# Patient Record
Sex: Male | Born: 1990 | Race: Black or African American | Hispanic: No | Marital: Single | Smoking: Never smoker
Health system: Southern US, Community
[De-identification: ages and names within clinical notes are randomized; demographics above are authoritative.]

---
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
## 2016-08-27 ENCOUNTER — Emergency Department (HOSPITAL_COMMUNITY)
Admission: EM | Admit: 2016-08-27 | Discharge: 2016-08-27 | Disposition: A | Discharge status: Home / Self Care | Payer: Self-pay | Attending: Emergency Medicine | Admitting: Emergency Medicine

## 2016-08-27 ENCOUNTER — Encounter (HOSPITAL_COMMUNITY): Payer: Self-pay | Admitting: Emergency Medicine

## 2016-08-27 ENCOUNTER — Emergency Department (HOSPITAL_COMMUNITY): Payer: Self-pay

## 2016-08-27 DIAGNOSIS — S63502A Unspecified sprain of left wrist, initial encounter: Secondary | ICD-10-CM | POA: Insufficient documentation

## 2016-08-27 DIAGNOSIS — Y929 Unspecified place or not applicable: Secondary | ICD-10-CM | POA: Insufficient documentation

## 2016-08-27 DIAGNOSIS — Y999 Unspecified external cause status: Secondary | ICD-10-CM | POA: Insufficient documentation

## 2016-08-27 DIAGNOSIS — S60812A Abrasion of left wrist, initial encounter: Secondary | ICD-10-CM | POA: Insufficient documentation

## 2016-08-27 DIAGNOSIS — Z23 Encounter for immunization: Secondary | ICD-10-CM | POA: Insufficient documentation

## 2016-08-27 DIAGNOSIS — Y939 Activity, unspecified: Secondary | ICD-10-CM | POA: Insufficient documentation

## 2016-08-27 MED ORDER — TETANUS-DIPHTH-ACELL PERTUSSIS 5-2.5-18.5 LF-MCG/0.5 IM SUSP
0.5000 mL | Freq: Once | INTRAMUSCULAR | Status: AC
  Administered 2016-08-27: 0.5 mL via INTRAMUSCULAR
  Filled 2016-08-27: qty 0.5

## 2016-08-27 NOTE — ED Notes (Signed)
Called ortho for thumb spica
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
## 2016-08-27 NOTE — ED Notes (Signed)
Ortho tech at bedside 

## 2016-08-27 NOTE — Discharge Instructions (Signed)
Wear wrist brace for at least 2 weeks for stabilization of wrist. Ice and elevate wrist throughout the day, using ice pack for no more than 20 minutes every hour.  Alternate between tylenol and motrin as needed for pain relief. Follow up with the hand specialist in 1-2 weeks for recheck of symptoms. Return to the ER for changes or worsening symptoms.
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
## 2021-01-03 ENCOUNTER — Encounter (HOSPITAL_COMMUNITY): Payer: Self-pay

## 2021-01-03 ENCOUNTER — Emergency Department (HOSPITAL_COMMUNITY): Payer: Self-pay

## 2021-01-03 ENCOUNTER — Other Ambulatory Visit: Payer: Self-pay

## 2021-01-03 ENCOUNTER — Emergency Department (HOSPITAL_COMMUNITY)
Admission: EM | Admit: 2021-01-03 | Discharge: 2021-01-03 | Disposition: A | Discharge status: Home / Self Care | Payer: Self-pay | Attending: Emergency Medicine | Admitting: Emergency Medicine

## 2021-01-03 DIAGNOSIS — S42021A Displaced fracture of shaft of right clavicle, initial encounter for closed fracture: Secondary | ICD-10-CM | POA: Insufficient documentation

## 2021-01-03 DIAGNOSIS — S60811A Abrasion of right wrist, initial encounter: Secondary | ICD-10-CM | POA: Insufficient documentation

## 2021-01-03 DIAGNOSIS — Z23 Encounter for immunization: Secondary | ICD-10-CM | POA: Insufficient documentation

## 2021-01-03 DIAGNOSIS — S80211A Abrasion, right knee, initial encounter: Secondary | ICD-10-CM | POA: Insufficient documentation

## 2021-01-03 DIAGNOSIS — R200 Anesthesia of skin: Secondary | ICD-10-CM | POA: Insufficient documentation

## 2021-01-03 DIAGNOSIS — S70211A Abrasion, right hip, initial encounter: Secondary | ICD-10-CM | POA: Insufficient documentation

## 2021-01-03 DIAGNOSIS — Y92410 Unspecified street and highway as the place of occurrence of the external cause: Secondary | ICD-10-CM | POA: Insufficient documentation

## 2021-01-03 DIAGNOSIS — S80811A Abrasion, right lower leg, initial encounter: Secondary | ICD-10-CM | POA: Insufficient documentation

## 2021-01-03 MED ORDER — TETANUS-DIPHTH-ACELL PERTUSSIS 5-2.5-18.5 LF-MCG/0.5 IM SUSY
0.5000 mL | PREFILLED_SYRINGE | Freq: Once | INTRAMUSCULAR | Status: AC
  Administered 2021-01-03: 0.5 mL via INTRAMUSCULAR
  Filled 2021-01-03: qty 0.5

## 2021-01-03 MED ORDER — HYDROCODONE-ACETAMINOPHEN 5-325 MG PO TABS: 1.0000 | ORAL_TABLET | Freq: Four times a day (QID) | ORAL | 0 refills | Status: AC | PRN

## 2021-01-03 MED ORDER — BACITRACIN ZINC 500 UNIT/GM EX OINT
TOPICAL_OINTMENT | Freq: Two times a day (BID) | CUTANEOUS | Status: DC
  Administered 2021-01-03: 1 via TOPICAL
  Filled 2021-01-03: qty 0.9

## 2021-01-03 NOTE — Discharge Instructions (Addendum)
Please read the attachment on clavicular fractures.  I would like for her to take ibuprofen 600 mg every 6 hours as needed for pain control.  You may supplement with Tylenol.  I have also prescribed you a short course of narcotics which can take as needed for breakthrough pain.  Please note that these medications can make you drowsy.  You are not allowed to work or drive while taking them.  Please call the office of Dr. Susa Simmonds, orthopedics, to schedule an appointment for ongoing evaluation and management.  You may ultimately require surgical intervention for your broken clavicle.  Return to the ER seek immediate medical attention should experience any new or worsening symptoms.
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
## 2021-01-03 NOTE — Progress Notes (Signed)
Orthopedic Tech Progress Note Patient Details:  Christian Chung [birth year] [id number]  Ortho Devices Type of Ortho Device: Shoulder immobilizer Ortho Device/Splint Location: RUE Ortho Device/Splint Interventions: Ordered, Application, Adjustment   Post Interventions Patient Tolerated: Well Instructions Provided: Care of device  [name] 01/03/2021, 2:02 PM

## 2021-01-03 NOTE — ED Notes (Signed)
Ortho en route for sling placement.

## 2021-01-03 NOTE — ED Notes (Signed)
Abrasions to right lower leg cleaned, dried and bacitracin placed with non adherent dressing to it. Pt tolerated well.
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
